# Patient Record
Sex: Male | Born: 1985
Health system: Southern US, Community
[De-identification: ages and names within clinical notes are randomized; demographics above are authoritative.]

## PROBLEM LIST (undated history)

## (undated) DIAGNOSIS — F17211 Nicotine dependence, cigarettes, in remission: Secondary | ICD-10-CM

## (undated) DIAGNOSIS — K921 Melena: Secondary | ICD-10-CM

## (undated) DIAGNOSIS — I83813 Varicose veins of bilateral lower extremities with pain: Secondary | ICD-10-CM

## (undated) DIAGNOSIS — E669 Obesity, unspecified: Secondary | ICD-10-CM

## (undated) DIAGNOSIS — I10 Essential (primary) hypertension: Secondary | ICD-10-CM

## (undated) HISTORY — DX: Obesity, unspecified: E66.9

## (undated) HISTORY — PX: NO PAST SURGERIES: SHX2092

## (undated) HISTORY — DX: Essential (primary) hypertension: I10

## (undated) HISTORY — DX: Nicotine dependence, cigarettes, in remission: F17.211

## (undated) HISTORY — DX: Melena: K92.1

## (undated) HISTORY — DX: Varicose veins of bilateral lower extremities with pain: I83.813

---

## 2020-02-03 DIAGNOSIS — Z13220 Encounter for screening for lipoid disorders: Secondary | ICD-10-CM | POA: Diagnosis not present

## 2020-02-03 DIAGNOSIS — F17200 Nicotine dependence, unspecified, uncomplicated: Secondary | ICD-10-CM | POA: Diagnosis not present

## 2020-02-03 DIAGNOSIS — I10 Essential (primary) hypertension: Secondary | ICD-10-CM | POA: Diagnosis not present

## 2020-02-03 DIAGNOSIS — I83813 Varicose veins of bilateral lower extremities with pain: Secondary | ICD-10-CM | POA: Diagnosis not present

## 2020-02-03 DIAGNOSIS — Z0000 Encounter for general adult medical examination without abnormal findings: Secondary | ICD-10-CM | POA: Diagnosis not present

## 2020-03-05 DIAGNOSIS — Z20822 Contact with and (suspected) exposure to covid-19: Secondary | ICD-10-CM | POA: Diagnosis not present

## 2020-03-08 DIAGNOSIS — Z6829 Body mass index (BMI) 29.0-29.9, adult: Secondary | ICD-10-CM | POA: Diagnosis not present

## 2020-03-08 DIAGNOSIS — F17200 Nicotine dependence, unspecified, uncomplicated: Secondary | ICD-10-CM | POA: Diagnosis not present

## 2020-03-08 DIAGNOSIS — J209 Acute bronchitis, unspecified: Secondary | ICD-10-CM | POA: Diagnosis not present

## 2020-03-08 DIAGNOSIS — I10 Essential (primary) hypertension: Secondary | ICD-10-CM | POA: Diagnosis not present

## 2020-03-23 ENCOUNTER — Other Ambulatory Visit: Payer: Self-pay

## 2020-03-23 DIAGNOSIS — I8390 Asymptomatic varicose veins of unspecified lower extremity: Secondary | ICD-10-CM

## 2020-04-15 ENCOUNTER — Ambulatory Visit: Payer: BC Managed Care – PPO | Admitting: Physician Assistant

## 2020-04-15 ENCOUNTER — Other Ambulatory Visit: Payer: Self-pay

## 2020-04-15 ENCOUNTER — Ambulatory Visit (HOSPITAL_COMMUNITY)
Admission: RE | Admit: 2020-04-15 | Discharge: 2020-04-15 | Disposition: A | Discharge status: Home / Self Care | Payer: BC Managed Care – PPO | Source: Ambulatory Visit | Attending: Vascular Surgery | Admitting: Vascular Surgery

## 2020-04-15 VITALS — BP 102/67 | HR 82 | Temp 98.7°F | Resp 20 | Ht 71.0 in | Wt 215.6 lb

## 2020-04-15 DIAGNOSIS — I8390 Asymptomatic varicose veins of unspecified lower extremity: Secondary | ICD-10-CM

## 2020-04-15 DIAGNOSIS — I872 Venous insufficiency (chronic) (peripheral): Secondary | ICD-10-CM | POA: Diagnosis not present

## 2020-04-15 NOTE — Progress Notes (Signed)
Office Note     CC:  follow up Requesting Provider:  Julianne Handler, NP  HPI: Raymond Byrd is a 35 y.o. (25-Nov-1985) male who presents for evaluation of bilateral lower extremity pain and varicose veins.  Patient describes episodes of pain that starts around the level of his knee and radiates down to his feet.  Episodes occur at rest and with exertion and can last anywhere from 20 to 30 minutes.  These episodes occur almost daily.  Patient states he has varicose veins especially of right lower extremity.  Patient's mother is also a patient at [facility] and is treated for her varicose veins.  He denies history of DVT, venous ulcerations, trauma, or prior vascular interventions.  He also denies claudication, rest pain, or nonhealing wounds of bilateral lower extremities.  He is a former smoker.  He was also diagnosed with sciatica of right lower extremity however its been about 10 years ago and he believes his symptoms are unrelated  History reviewed. No pertinent past medical history.  History reviewed. No pertinent surgical history.  Social History   Socioeconomic History  . Marital status: Unknown    Spouse name: Not on file  . Number of children: Not on file  . Years of education: Not on file  . Highest education level: Not on file  Occupational History  . Occupation: Holiday representative  Tobacco Use  . Smoking status: Former Smoker    Quit date: 02/27/2020    Years since quitting: 0.1  . Smokeless tobacco: Current User    Types: Snuff  Substance and Sexual Activity  . Alcohol use: Not on file  . Drug use: Not on file  . Sexual activity: Not on file  Other Topics Concern  . Not on file  Social History Narrative  . Not on file   Social Determinants of Health   Financial Resource Strain: Not on file  Food Insecurity: Not on file  Transportation Needs: Not on file  Physical Activity: Not on file  Stress: Not on file  Social Connections: Not on file  Intimate Partner Violence: Not on  file   History reviewed. No pertinent family history.  Current Outpatient Medications  Medication Sig Dispense Refill  . albuterol (VENTOLIN HFA) 108 (90 Base) MCG/ACT inhaler SMARTSIG:2 Puff(s) By Mouth Every 4-6 Hours PRN    . olmesartan (BENICAR) 20 MG tablet Take 20 mg by mouth daily.     No current facility-administered medications for this visit.    Allergies  Allergen Reactions  . Naproxen Hives     REVIEW OF SYSTEMS:   [X]  denotes positive finding, [ ]  denotes negative finding Cardiac  Comments:  Chest pain or chest pressure:    Shortness of breath upon exertion:    Short of breath when lying flat:    Irregular heart rhythm:        Vascular    Pain in calf, thigh, or hip brought on by ambulation:    Pain in feet at night that wakes you up from your sleep:     Blood clot in your veins:    Leg swelling:         Pulmonary    Oxygen at home:    Productive cough:     Wheezing:         Neurologic    Sudden weakness in arms or legs:     Sudden numbness in arms or legs:     Sudden onset of difficulty speaking or slurred speech:  Temporary loss of vision in one eye:     Problems with dizziness:         Gastrointestinal    Blood in stool:     Vomited blood:         Genitourinary    Burning when urinating:     Blood in urine:        Psychiatric    Major depression:         Hematologic    Bleeding problems:    Problems with blood clotting too easily:        Skin    Rashes or ulcers:        Constitutional    Fever or chills:      PHYSICAL EXAMINATION:  Vitals:   04/15/20 1135  BP: 102/67  Pulse: 82  Resp: 20  Temp: 98.7 F (37.1 C)  TempSrc: Temporal  SpO2: 97%  Weight: 215 lb 9.6 oz (97.8 kg)  Height: 5\' 11"  (1.803 m)    General:  WDWN in NAD; vital signs documented above Gait: Not observed HENT: WNL, normocephalic Pulmonary: normal non-labored breathing , without Rales, rhonchi,  wheezing Cardiac: regular HR Abdomen: soft, NT, no  masses Skin: without rashes Vascular Exam/Pulses:  Right Left  Radial 2+ (normal) 2+ (normal)  DP 2+ (normal) 2+ (normal)  PT absent absent    Extremities: without ischemic changes, without Gangrene , without cellulitis; without open wounds;  Musculoskeletal: no muscle wasting or atrophy  Neurologic: A&O X 3;  No focal weakness or paresthesias are detected Psychiatric:  The pt has Normal affect.   Non-Invasive Vascular Imaging:   Venous Reflux Times  +--------------------+---------+------+-----------+------------+--------+  RIGHT        Reflux NoRefluxReflux TimeDiameter cmsComments                  Yes                   +--------------------+---------+------+-----------+------------+--------+  CFV               yes  >1 second             +--------------------+---------+------+-----------+------------+--------+  FV prox       no                         +--------------------+---------+------+-----------+------------+--------+  FV mid       no                         +--------------------+---------+------+-----------+------------+--------+  FV dist       no                         +--------------------+---------+------+-----------+------------+--------+  Popliteal      no                         +--------------------+---------+------+-----------+------------+--------+  GSV at SFJ           yes  >500 ms   0.498        +--------------------+---------+------+-----------+------------+--------+  GSV prox thigh         yes  >500 ms   0.401        +--------------------+---------+------+-----------+------------+--------+  GSV mid thigh    no               0.432         +--------------------+---------+------+-----------+------------+--------+  GSV dist thigh  yes  >500 ms   0.632        +--------------------+---------+------+-----------+------------+--------+  GSV at knee           yes  >500 ms   0.365        +--------------------+---------+------+-----------+------------+--------+  GSV prox calf                    0.341        +--------------------+---------+------+-----------+------------+--------+  GSV mid calf                    0.353        +--------------------+---------+------+-----------+------------+--------+  SSV Pop Fossa    no               0.536        +--------------------+---------+------+-----------+------------+--------+  SSV prox calf    no               0.443        +--------------------+---------+------+-----------+------------+--------+  SSV mid calf                    0.595        +--------------------+---------+------+-----------+------------+--------+  AAGSV SFJ            yes  >500 ms   0.535        +--------------------+---------+------+-----------+------------+--------+  AAGSV Proximal thigh      yes  >500 ms   0.942        +--------------------+---------+------+-----------+------------+--------+  AAGSV mid thigh         yes  >500 ms       tortuous  +--------------------+---------+------+-----------+------------+--------+     +--------------+---------+------+-----------+------------+--------+  LEFT     Reflux NoRefluxReflux TimeDiameter cmsComments               Yes                   +--------------+---------+------+-----------+------------+--------+  CFV            yes  >1 second              +--------------+---------+------+-----------+------------+--------+  FV prox    no                         +--------------+---------+------+-----------+------------+--------+  FV mid    no                         +--------------+---------+------+-----------+------------+--------+  FV dist    no                         +--------------+---------+------+-----------+------------+--------+  Popliteal   no                         +--------------+---------+------+-----------+------------+--------+  GSV at SFJ        yes  >500 ms   0.561        +--------------+---------+------+-----------+------------+--------+  GSV prox thighno               0.538        +--------------+---------+------+-----------+------------+--------+  GSV mid thigh no               0.356        +--------------+---------+------+-----------+------------+--------+  GSV dist thighno               0.361        +--------------+---------+------+-----------+------------+--------+  GSV at knee  no  0.246        +--------------+---------+------+-----------+------------+--------+  GSV prox calf no               0.406        +--------------+---------+------+-----------+------------+--------+  GSV mid calf no               0.201        +--------------+---------+------+-----------+------------+--------+  SSV Pop Fossa no               0.426        +--------------+---------+------+-----------+------------+--------+  SSV prox calf no               0.444        +--------------+---------+------+-----------+------------+--------+  SSV mid calf                 0.486       ASSESSMENT/PLAN:: 35 y.o. male here for evaluation of BLE episodic pain and varicose veins  -Bilateral lower extremities are well perfused with a palpable and symmetrical DP pulses -Ropey varicosities noted along the path of the greater saphenous vein in the right lower extremity; patient also has an incompetent greater saphenous vein bilaterally -Given that patient's main concern is of episodic pain which occurs bilaterally, we will not pursue further work-up for laser ablation treatment -I have however recommended knee-high 15 to 20 mmHg compression to be worn daily, avoiding prolonged sitting and standing, elevating legs when possible, and using NSAIDs for symptoms related to varicose veins -Patient will follow up with PCP next month to reconsider etiology of episodic bilateral lower extremity pain; he may follow up with [facility] as needed   Emilie Rutter, PA-C [facility] [phone]  Clinic MD:   Randie Heinz

## 2020-06-06 DIAGNOSIS — K625 Hemorrhage of anus and rectum: Secondary | ICD-10-CM | POA: Diagnosis not present

## 2020-06-06 DIAGNOSIS — F17211 Nicotine dependence, cigarettes, in remission: Secondary | ICD-10-CM | POA: Diagnosis not present

## 2020-06-06 DIAGNOSIS — Z6829 Body mass index (BMI) 29.0-29.9, adult: Secondary | ICD-10-CM | POA: Diagnosis not present

## 2020-06-06 DIAGNOSIS — I10 Essential (primary) hypertension: Secondary | ICD-10-CM | POA: Diagnosis not present

## 2020-08-02 DIAGNOSIS — K921 Melena: Secondary | ICD-10-CM | POA: Diagnosis not present

## 2020-09-05 DIAGNOSIS — K921 Melena: Secondary | ICD-10-CM | POA: Diagnosis not present

## 2020-09-05 DIAGNOSIS — F17211 Nicotine dependence, cigarettes, in remission: Secondary | ICD-10-CM | POA: Diagnosis not present

## 2020-09-05 DIAGNOSIS — I10 Essential (primary) hypertension: Secondary | ICD-10-CM | POA: Diagnosis not present

## 2020-09-05 DIAGNOSIS — I83813 Varicose veins of bilateral lower extremities with pain: Secondary | ICD-10-CM | POA: Diagnosis not present

## 2020-10-27 DIAGNOSIS — R0609 Other forms of dyspnea: Secondary | ICD-10-CM | POA: Diagnosis not present

## 2020-10-27 DIAGNOSIS — I10 Essential (primary) hypertension: Secondary | ICD-10-CM | POA: Diagnosis not present

## 2020-10-27 DIAGNOSIS — K921 Melena: Secondary | ICD-10-CM | POA: Diagnosis not present

## 2020-10-27 DIAGNOSIS — F17211 Nicotine dependence, cigarettes, in remission: Secondary | ICD-10-CM | POA: Diagnosis not present

## 2020-11-01 ENCOUNTER — Telehealth: Payer: Self-pay

## 2020-11-04 ENCOUNTER — Encounter: Payer: Self-pay | Admitting: *Deleted

## 2020-11-04 ENCOUNTER — Encounter: Payer: Self-pay | Admitting: Cardiology

## 2020-11-04 DIAGNOSIS — K921 Melena: Secondary | ICD-10-CM | POA: Insufficient documentation

## 2020-11-04 DIAGNOSIS — E669 Obesity, unspecified: Secondary | ICD-10-CM | POA: Insufficient documentation

## 2020-11-04 DIAGNOSIS — I83813 Varicose veins of bilateral lower extremities with pain: Secondary | ICD-10-CM | POA: Insufficient documentation

## 2020-11-04 DIAGNOSIS — I10 Essential (primary) hypertension: Secondary | ICD-10-CM | POA: Insufficient documentation

## 2020-11-28 DIAGNOSIS — J984 Other disorders of lung: Secondary | ICD-10-CM | POA: Diagnosis not present

## 2020-11-28 DIAGNOSIS — R918 Other nonspecific abnormal finding of lung field: Secondary | ICD-10-CM | POA: Diagnosis not present

## 2020-11-28 DIAGNOSIS — R0602 Shortness of breath: Secondary | ICD-10-CM | POA: Diagnosis not present

## 2020-11-28 DIAGNOSIS — K625 Hemorrhage of anus and rectum: Secondary | ICD-10-CM | POA: Diagnosis not present

## 2020-11-28 DIAGNOSIS — K921 Melena: Secondary | ICD-10-CM | POA: Diagnosis not present

## 2020-11-28 DIAGNOSIS — J690 Pneumonitis due to inhalation of food and vomit: Secondary | ICD-10-CM | POA: Diagnosis not present

## 2020-11-29 NOTE — Telephone Encounter (Signed)
Close encounter 

## 2020-12-01 DIAGNOSIS — K921 Melena: Secondary | ICD-10-CM | POA: Diagnosis not present

## 2020-12-01 DIAGNOSIS — F17211 Nicotine dependence, cigarettes, in remission: Secondary | ICD-10-CM | POA: Diagnosis not present

## 2020-12-01 DIAGNOSIS — I10 Essential (primary) hypertension: Secondary | ICD-10-CM | POA: Diagnosis not present

## 2020-12-01 DIAGNOSIS — J690 Pneumonitis due to inhalation of food and vomit: Secondary | ICD-10-CM | POA: Diagnosis not present

## 2020-12-20 NOTE — Progress Notes (Signed)
Cardiology Office Note:    Date:  12/21/2020   ID:  Raymond Byrd, DOB November 29, 1985, MRN [id number]  PCP:  Julianne Handler, NP  Cardiologist:  Norman Herrlich, MD   Referring MD: Buckner Malta, MD     1. Palpitations   2. SOB (shortness of breath)   3. Angina pectoris (HCC)   4. Essential hypertension    PLAN:    In order of problems listed above:  He has mild cardiovascular complaints central to his rapid heart rhythm with associated shortness of breath and anginal chest pain.  The episodes are frequent several times a week we will plan to repeat event monitor today and when she has chest discomfort which is atypical for SVT we will have an ischemia evaluation with CTA.  I suspect we will quickly get an answer and I told him to wear the event monitor until he capture several episodes. Stable hypertension continue his ARB thiazide diuretic  Next appointment 6 weeks   Medication Adjustments/Labs and Tests Ordered: Current medicines are reviewed at length with the patient today.  Concerns regarding medicines are outlined above.  No orders of the defined types were placed in this encounter.  No orders of the defined types were placed in this encounter.    Chief Complaint  Patient presents with   Palpitations   Chest Pain   rapid HR    Ongoing for about a month    History of Present Illness:    Raymond Byrd is a 35 y.o. male with a history of hypertension who is being seen today for the evaluation of multiple cardiac complaints including palpitation shortness of breath and chest tightness at the request of Buckner Malta, MD.  He was seen in [facility] ED for chest pain 10/30/2020.  His high-sensitivity troponins were normal EKG showed no ischemic changes and he is felt appropriate for discharge for outpatient evaluation.  Laboratory studies showed hemoglobin 14.6 hematocrit 41.8 platelets 234,000 potassium 3.6 creatinine 1.04 N-terminal proBNP was quite low at 43  chest x-ray normal EKG x2 described as normal.  EKG 10/27/2020 independently reviewed shows sinus rhythm and is normal  His wife is a CNA who accompanies him participates in evaluation decision making For more than a year he has had episodes of intermittent rapid heart rhythm usually last for about 15 minutes he thinks he had 1 syncopal episode more than a year ago associated with it. Unfortunately he does not have a smart watch and he has not captured an episode His wife is felt his cholestyramine has been very perhaps Usually associated with physical effort forces him to stop and rest when he is breathless with think. Also at times he gets discomfort pressure substernal without radiation up into his throat. He has no known history of heart disease congenital rheumatic or heart murmur. Family history is unremarkable for heart disease cardiomyopathy or sudden death He takes no proarrhythmic over-the-counter medications Episodes are increasing frequency now several times a week but previously is gone weeks between episodes they have been variable in nature always associated with the rapid heart rhythm. He is a former smoker. Lipid profile 02/03/2020 at a cholesterol 174 HDL 34 triglycerides 153 and is not diabetic.  Past Medical History:  Diagnosis Date   Essential hypertension    Hematochezia    Nicotine dependence, cigarettes, in remission    Obesity    Varicose veins of leg with pain, bilateral     Past Surgical History:  Procedure Laterality  Date   NO PAST SURGERIES      Current Medications: No outpatient medications have been marked as taking for the 12/21/20 encounter (Office Visit) with Baldo Daub, MD.     Allergies:   Naproxen and Ace inhibitors   Social History   Socioeconomic History   Marital status: Unknown    Spouse name: Not on file   Number of children: Not on file   Years of education: Not on file   Highest education level: Not on file  Occupational  History   Occupation: Holiday representative  Tobacco Use   Smoking status: Former    Types: Cigarettes    Quit date: 02/2020    Years since quitting: 0.8   Smokeless tobacco: Current    Types: Chew  Substance and Sexual Activity   Alcohol use: Never   Drug use: Never   Sexual activity: Not on file  Other Topics Concern   Not on file  Social History Narrative   Not on file   Social Determinants of Health   Financial Resource Strain: Not on file  Food Insecurity: Not on file  Transportation Needs: Not on file  Physical Activity: Not on file  Stress: Not on file  Social Connections: Not on file     Family History: The patient's family history includes COPD in his mother; Emphysema in his mother; Hypertension in his father.  ROS:   ROS Please see the history of present illness.     All other systems reviewed and are negative.  EKGs/Labs/Other Studies Reviewed:    The following studies were reviewed today:   EKG:  EKG is  ordered today.  The ekg ordered today is personally reviewed and demonstrates sinus rhythm normal EKG no preexcitation QT interval is normal.  Physical Exam:    VS:  BP 126/82 (BP Location: Right Arm, Patient Position: Sitting)   Pulse 78   Ht 5\' 11"  (1.803 m)   Wt 234 lb 6.4 oz (106.3 kg)   SpO2 96%   BMI 32.69 kg/m     Wt Readings from Last 3 Encounters:  12/21/20 234 lb 6.4 oz (106.3 kg)  10/27/20 238 lb (108 kg)  04/15/20 215 lb 9.6 oz (97.8 kg)     GEN:   He appears his age looks healthy overweight in no acute distress HEENT: Normal NECK: No JVD; No carotid bruits LYMPHATICS: No lymphadenopathy CARDIAC: RRR, no murmurs, rubs, gallops RESPIRATORY:  Clear to auscultation without rales, wheezing or rhonchi  ABDOMEN: Soft, non-tender, non-distended MUSCULOSKELETAL:  No edema; No deformity  SKIN: Warm and dry NEUROLOGIC:  Alert and oriented x 3 PSYCHIATRIC:  Normal affect     Signed, 04/17/20, MD  12/21/2020 8:49 AM    [facility]

## 2020-12-21 ENCOUNTER — Ambulatory Visit: Payer: BC Managed Care – PPO | Admitting: Cardiology

## 2020-12-21 ENCOUNTER — Ambulatory Visit (INDEPENDENT_AMBULATORY_CARE_PROVIDER_SITE_OTHER): Payer: BC Managed Care – PPO

## 2020-12-21 ENCOUNTER — Other Ambulatory Visit: Payer: Self-pay

## 2020-12-21 ENCOUNTER — Encounter: Payer: Self-pay | Admitting: Cardiology

## 2020-12-21 VITALS — BP 126/82 | HR 78 | Ht 71.0 in | Wt 234.4 lb

## 2020-12-21 DIAGNOSIS — R0602 Shortness of breath: Secondary | ICD-10-CM | POA: Diagnosis not present

## 2020-12-21 DIAGNOSIS — I209 Angina pectoris, unspecified: Secondary | ICD-10-CM

## 2020-12-21 DIAGNOSIS — R002 Palpitations: Secondary | ICD-10-CM

## 2020-12-21 DIAGNOSIS — I10 Essential (primary) hypertension: Secondary | ICD-10-CM | POA: Diagnosis not present

## 2020-12-21 MED ORDER — METOPROLOL TARTRATE 100 MG PO TABS: 100.0000 mg | ORAL_TABLET | Freq: Once | ORAL | 0 refills | Status: DC

## 2020-12-21 NOTE — Patient Instructions (Signed)
Medication Instructions:  Your physician recommends that you continue on your current medications as directed. Please refer to the Current Medication list given to you today.  *If you need a refill on your cardiac medications before your next appointment, please call your pharmacy*   Lab Work: None If you have labs (blood work) drawn today and your tests are completely normal, you will receive your results only by: MyChart Message (if you have MyChart) OR A paper copy in the mail If you have any lab test that is abnormal or we need to change your treatment, we will call you to review the results.   Testing/Procedures: A zio monitor was ordered today. It will remain on for 14 days. You will then return monitor and event diary in provided box. It takes 1-2 weeks for report to be downloaded and returned to [place]. We will call you with the results. If monitor falls off or has orange flashing light, please call Zio for further instructions.     Your cardiac CT will be scheduled at one of the below locations:   [facility] [address] [place], [place] [address] [phone]  OR  [facility] [address] [place], [place] [address] [phone]  If scheduled at [facility], please arrive at the [facility] main entrance (entrance A) of [facility] 30 minutes prior to test start time. You can use the FREE valet parking offered at the main entrance (encouraged to control the heart rate for the test) Proceed to the [facility] Radiology Department (first floor) to check-in and test prep.  If scheduled at [facility], please arrive 15 mins early for check-in and test prep.  Please follow these instructions carefully (unless otherwise directed):  Hold all erectile dysfunction medications at least 3 days (72 hrs) prior to test.  On the Night Before the Test: Be sure to Drink plenty of  water. Do not consume any caffeinated/decaffeinated beverages or chocolate 12 hours prior to your test. Do not take any antihistamines 12 hours prior to your test.   On the Day of the Test: Drink plenty of water until 1 hour prior to the test. Do not eat any food 4 hours prior to the test. You may take your regular medications prior to the test.  Take metoprolol (Lopressor) two hours prior to test. HOLD Furosemide/Hydrochlorothiazide morning of the test.          After the Test: Drink plenty of water. After receiving IV contrast, you may experience a mild flushed feeling. This is normal. On occasion, you may experience a mild rash up to 24 hours after the test. This is not dangerous. If this occurs, you can take Benadryl 25 mg and increase your fluid intake. If you experience trouble breathing, this can be serious. If it is severe call 911 IMMEDIATELY. If it is mild, please call our office. If you take any of these medications: Glipizide/Metformin, Avandament, Glucavance, please do not take 48 hours after completing test unless otherwise instructed.  Please allow 2-4 weeks for scheduling of routine cardiac CTs. Some insurance companies require a pre-authorization which may delay scheduling of this test.   For non-scheduling related questions, please contact the cardiac imaging nurse navigator should you have any questions/concerns: Rockwell Alexandria, Cardiac Imaging Nurse Navigator Larey Brick, Cardiac Imaging Nurse Navigator [facility] Direct Office Dial: [phone]   For scheduling needs, including cancellations and rescheduling, please  call [place], [phone].    Follow-Up: At [facility], you and your health needs are our priority.  As part of our continuing mission to provide you with exceptional heart care, we have created designated Provider Care Teams.  These Care Teams include your primary Cardiologist (physician) and Advanced Practice  Providers (APPs -  Physician Assistants and Nurse Practitioners) who all work together to provide you with the care you need, when you need it.  We recommend signing up for the patient portal called "MyChart".  Sign up information is provided on this After Visit Summary.  MyChart is used to connect with patients for Virtual Visits (Telemedicine).  Patients are able to view lab/test results, encounter notes, upcoming appointments, etc.  Non-urgent messages can be sent to your provider as well.   To learn more about what you can do with MyChart, go to [email or web address].    Your next appointment:   6 week(s)  The format for your next appointment:   In Person  Provider:   Norman Herrlich, MD   Other Instructions  Cardiac CT Angiogram A cardiac CT angiogram is a procedure to look at the heart and the area around the heart. It may be done to help find the cause of chest pains or other symptoms of heart disease. During this procedure, a substance called contrast dye is injected into the blood vessels in the area to be checked. A large X-ray machine, called a CT scanner, then takes detailed pictures of the heart and the surrounding area. The procedure is also sometimes called a coronary CT angiogram, coronary artery scanning, or CTA. A cardiac CT angiogram allows the health care provider to see how well blood is flowing to and from the heart. The health care provider will be able to see if there are any problems, such as: Blockage or narrowing of the coronary arteries in the heart. Fluid around the heart. Signs of weakness or disease in the muscles, valves, and tissues of the heart. Tell a health care provider about: Any allergies you have. This is especially important if you have had a previous allergic reaction to contrast dye. All medicines you are taking, including vitamins, herbs, eye drops, creams, and over-the-counter medicines. Any blood disorders you have. Any surgeries you have  had. Any medical conditions you have. Whether you are pregnant or may be pregnant. Any anxiety disorders, chronic pain, or other conditions you have that may increase your stress or prevent you from lying still. What are the risks? Generally, this is a safe procedure. However, problems may occur, including: Bleeding. Infection. Allergic reactions to medicines or dyes. Damage to other structures or organs. Kidney damage from the contrast dye that is used. Increased risk of cancer from radiation exposure. This risk is low. Talk with your health care provider about: The risks and benefits of testing. How you can receive the lowest dose of radiation. What happens before the procedure? Wear comfortable clothing and remove any jewelry, glasses, dentures, and hearing aids. Follow instructions from your health care provider about eating and drinking. This may include: For 12 hours before the procedure -- avoid caffeine. This includes tea, coffee, soda, energy drinks, and diet pills. Drink plenty of water or other fluids that do not have caffeine in them. Being well hydrated can prevent complications. For 4-6 hours before the procedure -- stop eating and drinking. The contrast dye can cause nausea, but this is less likely if your stomach is empty. Ask your health care provider about changing  or stopping your regular medicines. This is especially important if you are taking diabetes medicines, blood thinners, or medicines to treat problems with erections (erectile dysfunction). What happens during the procedure?  Hair on your chest may need to be removed so that small sticky patches called electrodes can be placed on your chest. These will transmit information that helps to monitor your heart during the procedure. An IV will be inserted into one of your veins. You might be given a medicine to control your heart rate during the procedure. This will help to ensure that good images are obtained. You will  be asked to lie on an exam table. This table will slide in and out of the CT machine during the procedure. Contrast dye will be injected into the IV. You might feel warm, or you may get a metallic taste in your mouth. You will be given a medicine called nitroglycerin. This will relax or dilate the arteries in your heart. The table that you are lying on will move into the CT machine tunnel for the scan. The person running the machine will give you instructions while the scans are being done. You may be asked to: Keep your arms above your head. Hold your breath. Stay very still, even if the table is moving. When the scanning is complete, you will be moved out of the machine. The IV will be removed. The procedure may vary among health care providers and hospitals. What can I expect after the procedure? After your procedure, it is common to have: A metallic taste in your mouth from the contrast dye. A feeling of warmth. A headache from the nitroglycerin. Follow these instructions at home: Take over-the-counter and prescription medicines only as told by your health care provider. If you are told, drink enough fluid to keep your urine pale yellow. This will help to flush the contrast dye out of your body. Most people can return to their normal activities right after the procedure. Ask your health care provider what activities are safe for you. It is up to you to get the results of your procedure. Ask your health care provider, or the department that is doing the procedure, when your results will be ready. Keep all follow-up visits as told by your health care provider. This is important. Contact a health care provider if: You have any symptoms of allergy to the contrast dye. These include: Shortness of breath. Rash or hives. A racing heartbeat. Summary A cardiac CT angiogram is a procedure to look at the heart and the area around the heart. It may be done to help find the cause of chest pains or  other symptoms of heart disease. During this procedure, a large X-ray machine, called a CT scanner, takes detailed pictures of the heart and the surrounding area after a contrast dye has been injected into blood vessels in the area. Ask your health care provider about changing or stopping your regular medicines before the procedure. This is especially important if you are taking diabetes medicines, blood thinners, or medicines to treat erectile dysfunction. If you are told, drink enough fluid to keep your urine pale yellow. This will help to flush the contrast dye out of your body. This information is not intended to replace advice given to you by your health care provider. Make sure you discuss any questions you have with your health care provider. Document Revised: 10/08/2018 Document Reviewed: 10/08/2018 Elsevier Patient Education  2022 Elsevier Inc.       1.  Avoid all over-the-counter antihistamines except Claritin/Loratadine and Zyrtec/Cetrizine. 2. Avoid all combination including cold sinus allergies flu decongestant and sleep medications 3. You can use Robitussin DM Mucinex and Mucinex DM for cough. 4. can use Tylenol aspirin ibuprofen and naproxen but no combinations such as sleep or sinus.   KardiaMobile [email or web address]        FDA-cleared, clinical grade mobile EKG monitor: Lourena Simmonds is the most clinically-validated mobile EKG used by the world's leading cardiac care medical professionals With Basic service, know instantly if your heart rhythm is normal or if atrial fibrillation is detected, and email the last single EKG recording to yourself or your doctor Premium service, available for purchase through the Kardia app for $9.99 per month or $99 per year, includes unlimited history and storage of your EKG recordings, a monthly EKG summary report to share with your doctor, along with the ability to track your blood pressure, activity and weight  Includes one KardiaMobile phone clip FREE SHIPPING: Standard delivery 1-3 business days. Orders placed by 11:00am PST will ship that afternoon. Otherwise, will ship next business day. All orders ship via [employer] from [place], [place]        Overview A computerized tomography (CT) coronary angiogram is an imaging test that looks at the arteries that supply blood to your heart. It might be used to diagnose the cause of chest pain or other symptoms. A CT coronary angiogram relies on a powerful X-ray machine to produce images of your heart and its blood vessels. These tests are noninvasive and don't require recovery time. Coronary CT angiograms are increasingly an option for people with a variety of heart conditions. A traditional (not CT-based) coronary angiogram requires that a flexible tube (catheter) be threaded through your groin or arm to your heart or coronary arteries. If you have known coronary artery disease, your doctor might recommend a traditional coronary angiogram because you can also receive treatment during that procedure. Why it's done A coronary CT angiogram can check your heart for various conditions, but it's primarily used to check for narrowed or blocked arteries in your heart (coronary artery disease). If your test suggests that you have heart disease, you and your health care provider can discuss treatment options.  Risks You'll be exposed to some radiation during the test. The amount varies depending on the type of machine used. The risk of developing cancer from a CT angiogram isn't known, but it's small. However, you shouldn't have a CT angiogram if you're pregnant because of possible harm to your unborn child. It's possible that you could have an allergic reaction to the dye used in the procedure. Talk to your doctor if you're concerned about having an allergic reaction. How you prepare Your doctor should give you instructions about how to prepare for your CT  angiogram. You can drive yourself to the appointment, and you'll be able to drive after your test. Food and medication Usually, you'll be asked not to eat anything for about four hours before your test. You can drink water, but avoid caffeinated drinks 12 hours before your test, because they can increase your heart rate, which can make it difficult for your doctor to get clear pictures of your heart. If you're allergic to the dye used in the procedure, your doctor might ask you to take steroid medication 12 hours before the procedure to reduce your risk of a reaction. Clothing and personal items You'll need to remove clothing above your waist, as well as jewelry and glasses,  and change into a hospital gown. What you can expect CT angiograms are usually performed in the radiology department of a hospital or an outpatient imaging facility. Before the procedure Your doctor might give you a medication called a beta blocker to slow your heart rate, enabling the scan to produce a clearer image. Let your health care provider know if you've had side effects from beta blockers in the past. You might also be given nitroglycerin to widen (dilate) your coronary arteries. If you're allergic to contrast material, you might be given medication to lower your risk of a reaction. During the procedure A technician will give you numbing medication before inserting an intravenous (IV) line in your hand or arm to inject the dye that will make your heart's arteries visible on the images. Although the actual scanning portion of the test takes as few as five seconds, it may take up to an hour for the whole process. The technician will place electrodes on your chest to record your heart rate. You'll lie on a long table that slides through a short, tunnel-like machine. During the scan you'll need to stay still and hold your breath so as not to blur the images. If you're uncomfortable in closed spaces, ask your doctor if you need  medication to help you relax. A technician will operate the machine from a room that's separated from your exam room by a glass window. There will be an intercom system that allows you and the technician to communicate with each other. After the procedure After your CT angiogram is completed, you can return to your normal daily activities. You should be able to drive yourself home or to work. Drink plenty of water to help flush the dye from your system. The images from your CT angiogram should be ready soon after your test. Usually, the health care provider who asked you to have a CT angiogram should discuss the results of the test with you.

## 2020-12-30 DIAGNOSIS — R0609 Other forms of dyspnea: Secondary | ICD-10-CM | POA: Diagnosis not present

## 2020-12-30 DIAGNOSIS — J690 Pneumonitis due to inhalation of food and vomit: Secondary | ICD-10-CM | POA: Diagnosis not present

## 2020-12-30 DIAGNOSIS — I10 Essential (primary) hypertension: Secondary | ICD-10-CM | POA: Diagnosis not present

## 2020-12-30 DIAGNOSIS — F17211 Nicotine dependence, cigarettes, in remission: Secondary | ICD-10-CM | POA: Diagnosis not present

## 2021-01-04 ENCOUNTER — Telehealth (HOSPITAL_COMMUNITY): Payer: Self-pay | Admitting: *Deleted

## 2021-01-04 NOTE — Telephone Encounter (Signed)
Reaching out to patient to offer assistance regarding upcoming cardiac imaging study; pt verbalizes understanding of appt date/time, parking situation and where to check in, pre-test NPO status and medications ordered, and verified current allergies; name and call back number provided for further questions should they arise  Larey Brick RN Navigator Cardiac Imaging [facility] [phone] office [phone] cell  Patient to take 100mg  metoprolol tartrate two hours prior to cardiac CT scan. He is aware to arrive at 9am for his 9:30am cardiac CT scan.

## 2021-01-05 ENCOUNTER — Other Ambulatory Visit: Payer: Self-pay

## 2021-01-05 ENCOUNTER — Other Ambulatory Visit: Payer: Self-pay | Admitting: Cardiology

## 2021-01-05 ENCOUNTER — Encounter (HOSPITAL_COMMUNITY): Payer: Self-pay

## 2021-01-05 ENCOUNTER — Ambulatory Visit (HOSPITAL_COMMUNITY)
Admission: RE | Admit: 2021-01-05 | Discharge: 2021-01-05 | Disposition: A | Discharge status: Home / Self Care | Payer: BC Managed Care – PPO | Source: Ambulatory Visit | Attending: Cardiology | Admitting: Cardiology

## 2021-01-05 DIAGNOSIS — I209 Angina pectoris, unspecified: Secondary | ICD-10-CM | POA: Insufficient documentation

## 2021-01-05 DIAGNOSIS — R0602 Shortness of breath: Secondary | ICD-10-CM | POA: Diagnosis not present

## 2021-01-05 DIAGNOSIS — R002 Palpitations: Secondary | ICD-10-CM | POA: Insufficient documentation

## 2021-01-05 DIAGNOSIS — I10 Essential (primary) hypertension: Secondary | ICD-10-CM | POA: Diagnosis not present

## 2021-01-05 MED ORDER — IOHEXOL 350 MG/ML SOLN
95.0000 mL | Freq: Once | INTRAVENOUS | Status: AC | PRN
  Administered 2021-01-05: 95 mL via INTRAVENOUS

## 2021-01-05 MED ORDER — NITROGLYCERIN 0.4 MG SL SUBL
0.8000 mg | SUBLINGUAL_TABLET | Freq: Once | SUBLINGUAL | Status: AC
  Administered 2021-01-05: 0.8 mg via SUBLINGUAL

## 2021-01-05 MED ORDER — NITROGLYCERIN 0.4 MG SL SUBL
SUBLINGUAL_TABLET | SUBLINGUAL | Status: AC
  Filled 2021-01-05: qty 2

## 2021-01-09 ENCOUNTER — Telehealth: Payer: Self-pay

## 2021-01-09 NOTE — Telephone Encounter (Signed)
-----   Message from Baldo Daub, MD sent at 01/09/2021  9:05 AM EST ----- Great result cardiac CTA is normal.  He has a common problem fatty liver or hepatic steatosis and he needs to lose weight rarely in individuals with trouble with liver function

## 2021-01-09 NOTE — Telephone Encounter (Signed)
Left message on patients voicemail to please return our call.   

## 2021-01-09 NOTE — Telephone Encounter (Signed)
Patient is returning call.  °

## 2021-01-09 NOTE — Telephone Encounter (Signed)
Spoke with patient regarding results and recommendation.  Patient verbalizes understanding and is agreeable to plan of care. Advised patient to call back with any issues or concerns.  

## 2021-01-13 ENCOUNTER — Other Ambulatory Visit: Payer: Self-pay | Admitting: Cardiology

## 2021-01-13 ENCOUNTER — Telehealth: Payer: Self-pay

## 2021-01-13 MED ORDER — METOPROLOL SUCCINATE ER 25 MG PO TB24: 25.0000 mg | ORAL_TABLET | Freq: Every day | ORAL | 0 refills | Status: DC

## 2021-01-13 NOTE — Telephone Encounter (Signed)
Spoke with patient regarding results and recommendation.  Patient verbalizes understanding and is agreeable to plan of care. Advised patient to call back with any issues or concerns.  

## 2021-01-13 NOTE — Telephone Encounter (Signed)
-----   Message from Baldo Daub, MD sent at 01/13/2021  2:47 PM EST ----- His monitor shows occasional extra beats, PVCs, he is aware of these.  If bothersome we can place him on low-dose beta-blocker Toprol-XL 25 mg daily in the future he can take as needed.  If he wants to try it #30 take 1 daily and he should have appointment for follow-up with

## 2021-01-31 DIAGNOSIS — F17211 Nicotine dependence, cigarettes, in remission: Secondary | ICD-10-CM | POA: Insufficient documentation

## 2021-02-05 NOTE — Progress Notes (Signed)
Cardiology Office Note:    Date:  02/06/2021   ID:  Raymond Byrd, DOB 05-May-1985, MRN [id number]  PCP:  Julianne Handler, NP  Cardiologist:  Norman Herrlich, MD    Referring MD: Julianne Handler, NP    ASSESSMENT:    1. Chest pain of uncertain etiology   2. Essential hypertension   3. Palpitations    PLAN:    In order of problems listed above:  Chest pain is noncardiac calcium score of 0 normal coronary arteriography.  I think he benefit in terms of palpitation resting tachycardia and BP optimize therapy starting the beta-blocker at least until he loses weight.  The power of 0 last for about 5 to 7 years without diabetes placement very low cardiovascular risk but I do think he should repeat his calcium score in about 6 years.   Next appointment: As needed    Medication Adjustments/Labs and Tests Ordered: Current medicines are reviewed at length with the patient today.  Concerns regarding medicines are outlined above.  No orders of the defined types were placed in this encounter.  No orders of the defined types were placed in this encounter.   Chief complaint: Follow-up after cardiac CTA   History of Present Illness:    Raymond Byrd is a 35 y.o. male with a hx of hypertension last seen 12/21/2020 for the evaluation of multiple cardiac complaints including palpitation shortness of breath and chest pain.He was seen in [facility] for chest pain 10/30/2020.  His high-sensitivity troponins were normal EKG showed no ischemic changes and he is felt appropriate for discharge for outpatient evaluation.  Laboratory studies showed hemoglobin 14.6 hematocrit 41.8 platelets 234,000 potassium 3.6 creatinine 1.04 N-terminal proBNP was quite low at 43 chest x-ray normal EKG x2 described as normal.   Compliance with diet, lifestyle and medications: Yes  He is reassured by the findings of his cardiac CTA I did encourage him to do repeat coronary calcium score in 6 years. He notices his  heart seems rapid when he does vigorous activity but acknowledges he is overweight and is committed to weight loss approximately 30 pounds.  He does have hepatic steatosis. Smart watch shows resting heart rates running 100 -110 bpm he did not start a beta-blocker.  He underwent to be evaluated chest pain cardiac CTA was performed reported 01/06/2021 normal coronary origins normal coronary arteries coronary calcium score of 0.  He did have hepatic steatosis.  Event monitor for 14 days reported 01/13/2021 showed occasional ventricular ectopy and the symptomatic events were associated with PVCs and frequent PVCs. Past Medical History:  Diagnosis Date   Essential hypertension    Hematochezia    Nicotine dependence, cigarettes, in remission    Obesity    Varicose veins of leg with pain, bilateral     Past Surgical History:  Procedure Laterality Date   NO PAST SURGERIES      Current Medications: Current Meds  Medication Sig   losartan-hydrochlorothiazide (HYZAAR) 100-12.5 MG tablet Take 1 tablet by mouth daily.     Allergies:   Naproxen and Ace inhibitors   Social History   Socioeconomic History   Marital status: Unknown    Spouse name: Not on file   Number of children: Not on file   Years of education: Not on file   Highest education level: Not on file  Occupational History   Occupation: Holiday representative  Tobacco Use   Smoking status: Former    Types: Cigarettes  Quit date: 02/2020    Years since quitting: 0.9   Smokeless tobacco: Current    Types: Chew  Substance and Sexual Activity   Alcohol use: Never   Drug use: Never   Sexual activity: Not on file  Other Topics Concern   Not on file  Social History Narrative   Not on file   Social Determinants of Health   Financial Resource Strain: Not on file  Food Insecurity: Not on file  Transportation Needs: Not on file  Physical Activity: Not on file  Stress: Not on file  Social Connections: Not on file     Family  History: The patient's family history includes COPD in his mother; Emphysema in his mother; Hypertension in his father. ROS:   Please see the history of present illness.    All other systems reviewed and are negative.  EKGs/Labs/Other Studies Reviewed:    The following studies were reviewed today:    Physical Exam:    VS:  BP 134/82   Pulse 92   Ht 5\' 11"  (1.803 m)   Wt 238 lb 12.8 oz (108.3 kg)   SpO2 97%   BMI 33.31 kg/m     Wt Readings from Last 3 Encounters:  02/06/21 238 lb 12.8 oz (108.3 kg)  12/21/20 234 lb 6.4 oz (106.3 kg)  10/27/20 238 lb (108 kg)     GEN:  Well nourished, well developed in no acute distress HEENT: Normal NECK: No JVD; No carotid bruits LYMPHATICS: No lymphadenopathy CARDIAC: RRR, no murmurs, rubs, gallops RESPIRATORY:  Clear to auscultation without rales, wheezing or rhonchi  ABDOMEN: Soft, non-tender, non-distended MUSCULOSKELETAL:  No edema; No deformity  SKIN: Warm and dry NEUROLOGIC:  Alert and oriented x 3 PSYCHIATRIC:  Normal affect    Signed, Shirlee More, MD  02/06/2021 1:13 PM    [facility]

## 2021-02-06 ENCOUNTER — Ambulatory Visit: Payer: BC Managed Care – PPO | Admitting: Cardiology

## 2021-02-06 ENCOUNTER — Encounter: Payer: Self-pay | Admitting: Cardiology

## 2021-02-06 VITALS — BP 134/82 | HR 92 | Ht 71.0 in | Wt 238.8 lb

## 2021-02-06 DIAGNOSIS — I10 Essential (primary) hypertension: Secondary | ICD-10-CM | POA: Diagnosis not present

## 2021-02-06 DIAGNOSIS — Z0000 Encounter for general adult medical examination without abnormal findings: Secondary | ICD-10-CM | POA: Diagnosis not present

## 2021-02-06 DIAGNOSIS — E669 Obesity, unspecified: Secondary | ICD-10-CM | POA: Diagnosis not present

## 2021-02-06 DIAGNOSIS — R079 Chest pain, unspecified: Secondary | ICD-10-CM

## 2021-02-06 DIAGNOSIS — F17211 Nicotine dependence, cigarettes, in remission: Secondary | ICD-10-CM | POA: Diagnosis not present

## 2021-02-06 DIAGNOSIS — R002 Palpitations: Secondary | ICD-10-CM

## 2021-02-06 NOTE — Patient Instructions (Signed)
Medication Instructions:  Your physician has recommended you make the following change in your medication:   Take Toprol XL daily  *If you need a refill on your cardiac medications before your next appointment, please call your pharmacy*   Lab Work: None ordered If you have labs (blood work) drawn today and your tests are completely normal, you will receive your results only by: MyChart Message (if you have MyChart) OR A paper copy in the mail If you have any lab test that is abnormal or we need to change your treatment, we will call you to review the results.   Testing/Procedures: None ordered    Follow-Up: At [facility], you and your health needs are our priority.  As part of our continuing mission to provide you with exceptional heart care, we have created designated Provider Care Teams.  These Care Teams include your primary Cardiologist (physician) and Advanced Practice Providers (APPs -  Physician Assistants and Nurse Practitioners) who all work together to provide you with the care you need, when you need it.  We recommend signing up for the patient portal called "MyChart".  Sign up information is provided on this After Visit Summary.  MyChart is used to connect with patients for Virtual Visits (Telemedicine).  Patients are able to view lab/test results, encounter notes, upcoming appointments, etc.  Non-urgent messages can be sent to your provider as well.   To learn more about what you can do with MyChart, go to [email or web address].    Your next appointment:   As needed  The format for your next appointment:   In Person  Provider:   Norman Herrlich, MD   Other Instructions  Coronary Calcium Scan A coronary calcium scan is an imaging test used to look for deposits of plaque in the inner lining of the blood vessels of the heart (coronary arteries). Plaque is made up of calcium, protein, and fatty substances. These deposits of plaque can partly clog and narrow the  coronary arteries without producing any symptoms or warning signs. This puts a person at risk for a heart attack. This test is recommended for people who are at moderate risk for heart disease. The test can find plaque deposits before symptoms develop. Tell a health care provider about: Any allergies you have. All medicines you are taking, including vitamins, herbs, eye drops, creams, and over-the-counter medicines. Any problems you or family members have had with anesthetic medicines. Any blood disorders you have. Any surgeries you have had. Any medical conditions you have. Whether you are pregnant or may be pregnant. What are the risks? Generally, this is a safe procedure. However, problems may occur, including: Harm to a pregnant woman and her unborn baby. This test involves the use of radiation. Radiation exposure can be dangerous to a pregnant woman and her unborn baby. If you are pregnant or think you may be pregnant, you should not have this procedure done. Slight increase in the risk of cancer. This is because of the radiation involved in the test. What happens before the procedure? Ask your health care provider for any specific instructions on how to prepare for this procedure. You may be asked to avoid products that contain caffeine, tobacco, or nicotine for 4 hours before the procedure. What happens during the procedure? You will undress and remove any jewelry from your neck or chest. You will put on a hospital gown. Sticky electrodes will be placed on your chest. The electrodes will be connected to an electrocardiogram (ECG) machine to  record a tracing of the electrical activity of your heart. You will lie down on a curved bed that is attached to the CT scanner. You may be given medicine to slow down your heart rate so that clear pictures can be created. You will be moved into the CT scanner, and the CT scanner will take pictures of your heart. During this time, you will be asked to  lie still and hold your breath for 2-3 seconds at a time while each picture of your heart is being taken. The procedure may vary among health care providers and hospitals.    What happens after the procedure? You can get dressed. You can return to your normal activities. It is up to you to get the results of your procedure. Ask your health care provider, or the department that is doing the procedure, when your results will be ready. Summary A coronary calcium scan is an imaging test used to look for deposits of plaque in the inner lining of the blood vessels of the heart (coronary arteries). Plaque is made up of calcium, protein, and fatty substances. Generally, this is a safe procedure. Tell your health care provider if you are pregnant or may be pregnant. Ask your health care provider for any specific instructions on how to prepare for this procedure. A CT scanner will take pictures of your heart. You can return to your normal activities after the scan is done. This information is not intended to replace advice given to you by your health care provider. Make sure you discuss any questions you have with your health care provider. Document Revised: 09/02/2018 Document Reviewed: 09/02/2018 Elsevier Patient Education  2021 [employer].

## 2021-02-13 ENCOUNTER — Telehealth: Payer: Self-pay | Admitting: Cardiology

## 2021-02-13 NOTE — Telephone Encounter (Signed)
Office notes sent per request.

## 2021-02-13 NOTE — Telephone Encounter (Signed)
Raymond Byrd with [facility] is calling to confirm that patient showed up for appointment on 12/12 with Dr. Dulce Sellar. I confirmed patient came in for appointment and she requested office visit notes. She would like to have the notes faxed to [phone].

## 2021-02-15 DIAGNOSIS — Z20822 Contact with and (suspected) exposure to covid-19: Secondary | ICD-10-CM | POA: Diagnosis not present

## 2021-02-15 DIAGNOSIS — J029 Acute pharyngitis, unspecified: Secondary | ICD-10-CM | POA: Diagnosis not present

## 2021-02-15 DIAGNOSIS — J069 Acute upper respiratory infection, unspecified: Secondary | ICD-10-CM | POA: Diagnosis not present

## 2021-03-03 DIAGNOSIS — I10 Essential (primary) hypertension: Secondary | ICD-10-CM | POA: Diagnosis not present

## 2021-03-03 DIAGNOSIS — R0602 Shortness of breath: Secondary | ICD-10-CM | POA: Diagnosis not present

## 2021-03-03 DIAGNOSIS — R002 Palpitations: Secondary | ICD-10-CM | POA: Diagnosis not present

## 2021-03-03 DIAGNOSIS — R42 Dizziness and giddiness: Secondary | ICD-10-CM | POA: Diagnosis not present

## 2021-03-03 DIAGNOSIS — I83001 Varicose veins of unspecified lower extremity with ulcer of thigh: Secondary | ICD-10-CM | POA: Diagnosis not present

## 2021-03-05 ENCOUNTER — Other Ambulatory Visit: Payer: Self-pay | Admitting: Cardiology

## 2021-03-06 ENCOUNTER — Other Ambulatory Visit: Payer: Self-pay | Admitting: Cardiology

## 2021-03-10 DIAGNOSIS — R002 Palpitations: Secondary | ICD-10-CM | POA: Diagnosis not present

## 2021-04-02 DIAGNOSIS — Z886 Allergy status to analgesic agent status: Secondary | ICD-10-CM | POA: Diagnosis not present

## 2021-04-02 DIAGNOSIS — X500XXA Overexertion from strenuous movement or load, initial encounter: Secondary | ICD-10-CM | POA: Diagnosis not present

## 2021-04-02 DIAGNOSIS — M4306 Spondylolysis, lumbar region: Secondary | ICD-10-CM | POA: Diagnosis not present

## 2021-04-02 DIAGNOSIS — R0789 Other chest pain: Secondary | ICD-10-CM | POA: Diagnosis not present

## 2021-04-02 DIAGNOSIS — M5441 Lumbago with sciatica, right side: Secondary | ICD-10-CM | POA: Diagnosis not present

## 2021-04-02 DIAGNOSIS — Z79899 Other long term (current) drug therapy: Secondary | ICD-10-CM | POA: Diagnosis not present

## 2021-04-02 DIAGNOSIS — M545 Low back pain, unspecified: Secondary | ICD-10-CM | POA: Diagnosis not present

## 2021-04-02 DIAGNOSIS — S39012A Strain of muscle, fascia and tendon of lower back, initial encounter: Secondary | ICD-10-CM | POA: Diagnosis not present

## 2021-04-02 DIAGNOSIS — Z87891 Personal history of nicotine dependence: Secondary | ICD-10-CM | POA: Diagnosis not present

## 2021-04-02 DIAGNOSIS — M47816 Spondylosis without myelopathy or radiculopathy, lumbar region: Secondary | ICD-10-CM | POA: Diagnosis not present

## 2021-04-02 DIAGNOSIS — R079 Chest pain, unspecified: Secondary | ICD-10-CM | POA: Diagnosis not present

## 2021-04-02 DIAGNOSIS — M5431 Sciatica, right side: Secondary | ICD-10-CM | POA: Diagnosis not present

## 2021-04-04 ENCOUNTER — Other Ambulatory Visit: Payer: Self-pay | Admitting: Cardiology

## 2021-04-04 DIAGNOSIS — R002 Palpitations: Secondary | ICD-10-CM | POA: Diagnosis not present

## 2021-04-04 DIAGNOSIS — R079 Chest pain, unspecified: Secondary | ICD-10-CM | POA: Diagnosis not present

## 2021-04-04 DIAGNOSIS — R0602 Shortness of breath: Secondary | ICD-10-CM | POA: Diagnosis not present

## 2021-04-19 DIAGNOSIS — R002 Palpitations: Secondary | ICD-10-CM | POA: Diagnosis not present

## 2021-04-19 DIAGNOSIS — R079 Chest pain, unspecified: Secondary | ICD-10-CM | POA: Diagnosis not present

## 2021-04-19 DIAGNOSIS — R0602 Shortness of breath: Secondary | ICD-10-CM | POA: Diagnosis not present

## 2021-05-05 ENCOUNTER — Other Ambulatory Visit: Payer: Self-pay | Admitting: Cardiology

## 2021-05-05 NOTE — Telephone Encounter (Signed)
Metoprolol Succinate 25 mg # 30 tablets only with message for pharmacy and patient to request future refills from pcp. Patient is followed with our office as needed ?

## 2021-05-11 DIAGNOSIS — R002 Palpitations: Secondary | ICD-10-CM | POA: Diagnosis not present

## 2021-05-11 DIAGNOSIS — R0602 Shortness of breath: Secondary | ICD-10-CM | POA: Diagnosis not present

## 2021-05-11 DIAGNOSIS — R079 Chest pain, unspecified: Secondary | ICD-10-CM | POA: Diagnosis not present

## 2021-06-03 ENCOUNTER — Other Ambulatory Visit: Payer: Self-pay | Admitting: Cardiology

## 2021-06-06 DIAGNOSIS — R002 Palpitations: Secondary | ICD-10-CM | POA: Diagnosis not present

## 2021-06-06 DIAGNOSIS — M7989 Other specified soft tissue disorders: Secondary | ICD-10-CM | POA: Diagnosis not present

## 2021-06-06 DIAGNOSIS — R0602 Shortness of breath: Secondary | ICD-10-CM | POA: Diagnosis not present

## 2021-06-06 DIAGNOSIS — I83001 Varicose veins of unspecified lower extremity with ulcer of thigh: Secondary | ICD-10-CM | POA: Diagnosis not present

## 2021-06-06 DIAGNOSIS — I10 Essential (primary) hypertension: Secondary | ICD-10-CM | POA: Diagnosis not present

## 2021-06-27 DIAGNOSIS — M7989 Other specified soft tissue disorders: Secondary | ICD-10-CM | POA: Diagnosis not present

## 2021-08-07 DIAGNOSIS — G2581 Restless legs syndrome: Secondary | ICD-10-CM | POA: Diagnosis not present

## 2021-08-07 DIAGNOSIS — I10 Essential (primary) hypertension: Secondary | ICD-10-CM | POA: Diagnosis not present

## 2021-08-07 DIAGNOSIS — F17211 Nicotine dependence, cigarettes, in remission: Secondary | ICD-10-CM | POA: Diagnosis not present

## 2021-08-07 DIAGNOSIS — I83813 Varicose veins of bilateral lower extremities with pain: Secondary | ICD-10-CM | POA: Diagnosis not present

## 2021-08-25 DIAGNOSIS — R000 Tachycardia, unspecified: Secondary | ICD-10-CM | POA: Diagnosis not present

## 2021-08-25 DIAGNOSIS — I83813 Varicose veins of bilateral lower extremities with pain: Secondary | ICD-10-CM | POA: Diagnosis not present

## 2021-08-25 DIAGNOSIS — I10 Essential (primary) hypertension: Secondary | ICD-10-CM | POA: Diagnosis not present

## 2021-08-25 DIAGNOSIS — R0789 Other chest pain: Secondary | ICD-10-CM | POA: Diagnosis not present

## 2021-09-26 DIAGNOSIS — Z6832 Body mass index (BMI) 32.0-32.9, adult: Secondary | ICD-10-CM | POA: Diagnosis not present

## 2021-09-26 DIAGNOSIS — R0602 Shortness of breath: Secondary | ICD-10-CM | POA: Diagnosis not present

## 2021-09-26 DIAGNOSIS — Z87891 Personal history of nicotine dependence: Secondary | ICD-10-CM | POA: Diagnosis not present

## 2021-09-26 DIAGNOSIS — E669 Obesity, unspecified: Secondary | ICD-10-CM | POA: Diagnosis not present

## 2021-09-26 DIAGNOSIS — I10 Essential (primary) hypertension: Secondary | ICD-10-CM | POA: Diagnosis not present

## 2021-12-11 DIAGNOSIS — I83813 Varicose veins of bilateral lower extremities with pain: Secondary | ICD-10-CM | POA: Diagnosis not present

## 2021-12-11 DIAGNOSIS — R0602 Shortness of breath: Secondary | ICD-10-CM | POA: Diagnosis not present

## 2021-12-11 DIAGNOSIS — I10 Essential (primary) hypertension: Secondary | ICD-10-CM | POA: Diagnosis not present

## 2021-12-18 DIAGNOSIS — I83819 Varicose veins of unspecified lower extremities with pain: Secondary | ICD-10-CM | POA: Diagnosis not present

## 2021-12-18 DIAGNOSIS — I10 Essential (primary) hypertension: Secondary | ICD-10-CM | POA: Diagnosis not present

## 2021-12-18 DIAGNOSIS — R0602 Shortness of breath: Secondary | ICD-10-CM | POA: Diagnosis not present

## 2021-12-18 DIAGNOSIS — I83893 Varicose veins of bilateral lower extremities with other complications: Secondary | ICD-10-CM | POA: Diagnosis not present

## 2022-01-02 DIAGNOSIS — Z886 Allergy status to analgesic agent status: Secondary | ICD-10-CM | POA: Diagnosis not present

## 2022-01-02 DIAGNOSIS — Z79899 Other long term (current) drug therapy: Secondary | ICD-10-CM | POA: Diagnosis not present

## 2022-01-02 DIAGNOSIS — Z87891 Personal history of nicotine dependence: Secondary | ICD-10-CM | POA: Diagnosis not present

## 2022-01-02 DIAGNOSIS — I83811 Varicose veins of right lower extremities with pain: Secondary | ICD-10-CM | POA: Diagnosis not present

## 2022-01-02 DIAGNOSIS — Z6833 Body mass index (BMI) 33.0-33.9, adult: Secondary | ICD-10-CM | POA: Diagnosis not present

## 2022-01-02 DIAGNOSIS — I10 Essential (primary) hypertension: Secondary | ICD-10-CM | POA: Diagnosis not present

## 2022-01-02 DIAGNOSIS — E669 Obesity, unspecified: Secondary | ICD-10-CM | POA: Diagnosis not present

## 2022-01-29 DIAGNOSIS — E782 Mixed hyperlipidemia: Secondary | ICD-10-CM | POA: Diagnosis not present

## 2022-01-29 DIAGNOSIS — G2581 Restless legs syndrome: Secondary | ICD-10-CM | POA: Diagnosis not present

## 2022-01-29 DIAGNOSIS — F17211 Nicotine dependence, cigarettes, in remission: Secondary | ICD-10-CM | POA: Diagnosis not present

## 2022-01-29 DIAGNOSIS — I10 Essential (primary) hypertension: Secondary | ICD-10-CM | POA: Diagnosis not present

## 2022-06-12 DIAGNOSIS — I10 Essential (primary) hypertension: Secondary | ICD-10-CM | POA: Diagnosis not present

## 2022-06-12 DIAGNOSIS — Z1330 Encounter for screening examination for mental health and behavioral disorders, unspecified: Secondary | ICD-10-CM | POA: Diagnosis not present

## 2022-06-12 DIAGNOSIS — M7989 Other specified soft tissue disorders: Secondary | ICD-10-CM | POA: Diagnosis not present

## 2022-06-12 DIAGNOSIS — M549 Dorsalgia, unspecified: Secondary | ICD-10-CM | POA: Diagnosis not present

## 2022-06-12 DIAGNOSIS — M5431 Sciatica, right side: Secondary | ICD-10-CM | POA: Diagnosis not present

## 2022-06-20 DIAGNOSIS — M7989 Other specified soft tissue disorders: Secondary | ICD-10-CM | POA: Diagnosis not present

## 2022-06-28 DIAGNOSIS — I10 Essential (primary) hypertension: Secondary | ICD-10-CM | POA: Diagnosis not present

## 2022-06-28 DIAGNOSIS — M5416 Radiculopathy, lumbar region: Secondary | ICD-10-CM | POA: Diagnosis not present

## 2022-07-16 DIAGNOSIS — M4727 Other spondylosis with radiculopathy, lumbosacral region: Secondary | ICD-10-CM | POA: Diagnosis not present

## 2022-07-16 DIAGNOSIS — Z1389 Encounter for screening for other disorder: Secondary | ICD-10-CM | POA: Diagnosis not present

## 2022-07-16 DIAGNOSIS — M4807 Spinal stenosis, lumbosacral region: Secondary | ICD-10-CM | POA: Diagnosis not present

## 2022-07-16 DIAGNOSIS — M5117 Intervertebral disc disorders with radiculopathy, lumbosacral region: Secondary | ICD-10-CM | POA: Diagnosis not present

## 2022-08-06 DIAGNOSIS — M5416 Radiculopathy, lumbar region: Secondary | ICD-10-CM | POA: Diagnosis not present

## 2022-08-14 DIAGNOSIS — M5416 Radiculopathy, lumbar region: Secondary | ICD-10-CM | POA: Diagnosis not present

## 2022-08-21 DIAGNOSIS — M5416 Radiculopathy, lumbar region: Secondary | ICD-10-CM | POA: Diagnosis not present

## 2022-08-21 DIAGNOSIS — I10 Essential (primary) hypertension: Secondary | ICD-10-CM | POA: Diagnosis not present

## 2022-08-22 DIAGNOSIS — M5416 Radiculopathy, lumbar region: Secondary | ICD-10-CM | POA: Diagnosis not present

## 2022-08-27 DIAGNOSIS — M5416 Radiculopathy, lumbar region: Secondary | ICD-10-CM | POA: Diagnosis not present

## 2022-09-03 DIAGNOSIS — M5416 Radiculopathy, lumbar region: Secondary | ICD-10-CM | POA: Diagnosis not present

## 2022-09-10 DIAGNOSIS — M5416 Radiculopathy, lumbar region: Secondary | ICD-10-CM | POA: Diagnosis not present

## 2022-10-12 DIAGNOSIS — I83813 Varicose veins of bilateral lower extremities with pain: Secondary | ICD-10-CM | POA: Diagnosis not present

## 2022-10-12 DIAGNOSIS — I10 Essential (primary) hypertension: Secondary | ICD-10-CM | POA: Diagnosis not present

## 2022-10-12 DIAGNOSIS — E669 Obesity, unspecified: Secondary | ICD-10-CM | POA: Diagnosis not present

## 2022-11-27 DIAGNOSIS — I10 Essential (primary) hypertension: Secondary | ICD-10-CM | POA: Diagnosis not present

## 2022-11-27 DIAGNOSIS — M47816 Spondylosis without myelopathy or radiculopathy, lumbar region: Secondary | ICD-10-CM | POA: Diagnosis not present

## 2022-11-27 DIAGNOSIS — S335XXA Sprain of ligaments of lumbar spine, initial encounter: Secondary | ICD-10-CM | POA: Diagnosis not present

## 2022-11-27 DIAGNOSIS — M5416 Radiculopathy, lumbar region: Secondary | ICD-10-CM | POA: Diagnosis not present

## 2022-12-03 DIAGNOSIS — I10 Essential (primary) hypertension: Secondary | ICD-10-CM | POA: Diagnosis not present

## 2022-12-03 DIAGNOSIS — G479 Sleep disorder, unspecified: Secondary | ICD-10-CM | POA: Diagnosis not present

## 2022-12-03 DIAGNOSIS — F17211 Nicotine dependence, cigarettes, in remission: Secondary | ICD-10-CM | POA: Diagnosis not present

## 2022-12-04 DIAGNOSIS — Z1331 Encounter for screening for depression: Secondary | ICD-10-CM | POA: Diagnosis not present

## 2022-12-04 DIAGNOSIS — Z13228 Encounter for screening for other metabolic disorders: Secondary | ICD-10-CM | POA: Diagnosis not present

## 2022-12-04 DIAGNOSIS — I10 Essential (primary) hypertension: Secondary | ICD-10-CM | POA: Diagnosis not present

## 2022-12-04 DIAGNOSIS — E66811 Obesity, class 1: Secondary | ICD-10-CM | POA: Diagnosis not present

## 2022-12-04 DIAGNOSIS — Z713 Dietary counseling and surveillance: Secondary | ICD-10-CM | POA: Diagnosis not present

## 2022-12-04 DIAGNOSIS — Z6834 Body mass index (BMI) 34.0-34.9, adult: Secondary | ICD-10-CM | POA: Diagnosis not present

## 2022-12-04 DIAGNOSIS — Z7182 Exercise counseling: Secondary | ICD-10-CM | POA: Diagnosis not present

## 2022-12-10 DIAGNOSIS — M5416 Radiculopathy, lumbar region: Secondary | ICD-10-CM | POA: Diagnosis not present

## 2022-12-25 DIAGNOSIS — I10 Essential (primary) hypertension: Secondary | ICD-10-CM | POA: Diagnosis not present

## 2022-12-25 DIAGNOSIS — R7309 Other abnormal glucose: Secondary | ICD-10-CM | POA: Diagnosis not present

## 2022-12-25 DIAGNOSIS — E66812 Obesity, class 2: Secondary | ICD-10-CM | POA: Diagnosis not present

## 2022-12-25 DIAGNOSIS — Z6834 Body mass index (BMI) 34.0-34.9, adult: Secondary | ICD-10-CM | POA: Diagnosis not present

## 2022-12-25 DIAGNOSIS — G4733 Obstructive sleep apnea (adult) (pediatric): Secondary | ICD-10-CM | POA: Diagnosis not present

## 2022-12-25 DIAGNOSIS — R7303 Prediabetes: Secondary | ICD-10-CM | POA: Diagnosis not present

## 2023-02-08 DIAGNOSIS — J40 Bronchitis, not specified as acute or chronic: Secondary | ICD-10-CM | POA: Diagnosis not present

## 2023-02-08 DIAGNOSIS — Z87891 Personal history of nicotine dependence: Secondary | ICD-10-CM | POA: Diagnosis not present

## 2023-02-08 DIAGNOSIS — R059 Cough, unspecified: Secondary | ICD-10-CM | POA: Diagnosis not present

## 2023-02-08 DIAGNOSIS — R0981 Nasal congestion: Secondary | ICD-10-CM | POA: Diagnosis not present

## 2023-02-08 DIAGNOSIS — R0602 Shortness of breath: Secondary | ICD-10-CM | POA: Diagnosis not present

## 2023-08-14 IMAGING — CT CT HEART MORP W/ CTA COR W/ SCORE W/ CA W/CM &/OR W/O CM
4 of 7 series · 8 of 20 positions shown, 9 images · IV contrast (APPLIED)
Comparison: None.
COMPARISON: None.

Addendum:
EXAM:
OVER-READ INTERPRETATION  CT CHEST

The following report is an over-read performed by radiologist Dr.
Shiamaa Bird [REDACTED] on 01/05/2021. This
over-read does not include interpretation of cardiac or coronary
anatomy or pathology. The coronary calcium score/coronary CTA
interpretation by the cardiologist is attached.
CLINICAL DATA: CP, SVT
Cardiac/Coronary  CTA
TECHNIQUE: The patient was scanned on a [employer] Force scanner.

[Series 6: best diast · axial · 0.37mm/px · z∈[+1085,+1125]mm · 2 of 301 slices shown, 3 images]
[im 101/301  vessel]
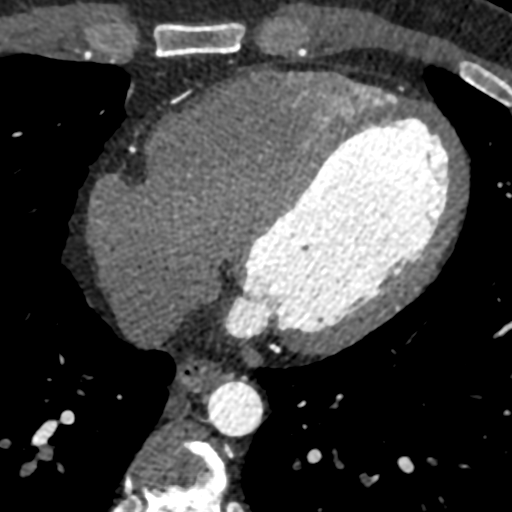
[im 101/301  lung]
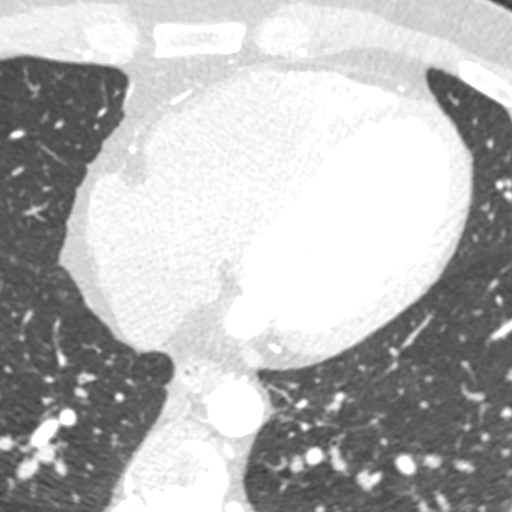
[im 201/301  vessel]
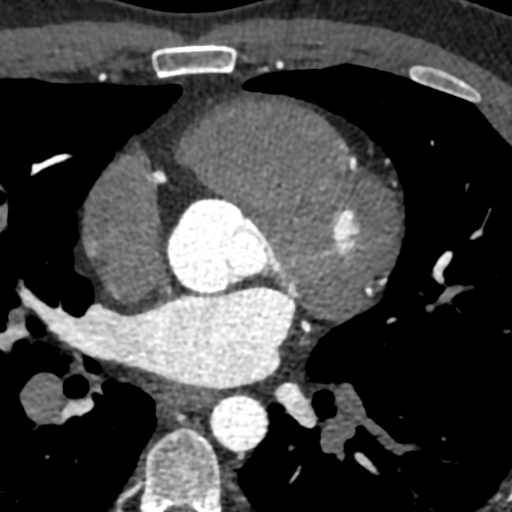

[Series 7: best syst · axial · 0.37mm/px · z∈[+1085,+1125]mm · 2 of 301 slices shown]
[im 101/301  vessel]
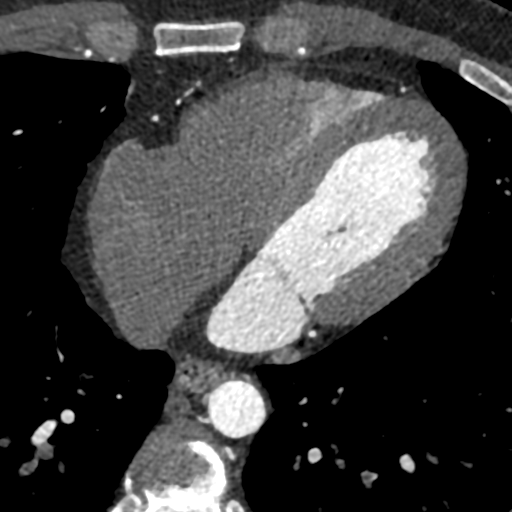
[im 201/301  vessel]
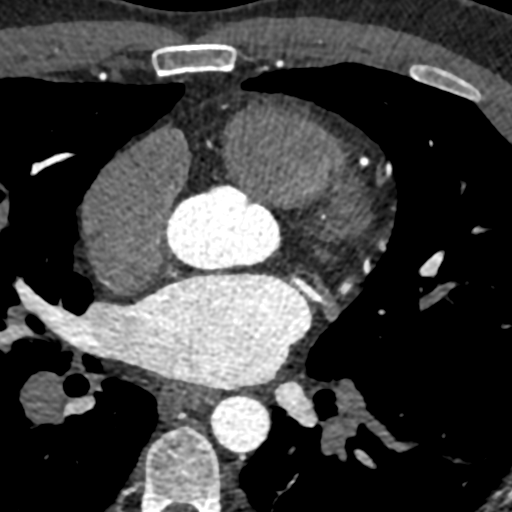

[Series 8: ts diast · axial · 0.37mm/px · z∈[+1085,+1125]mm · 2 of 301 slices shown]
[im 101/301  vessel]
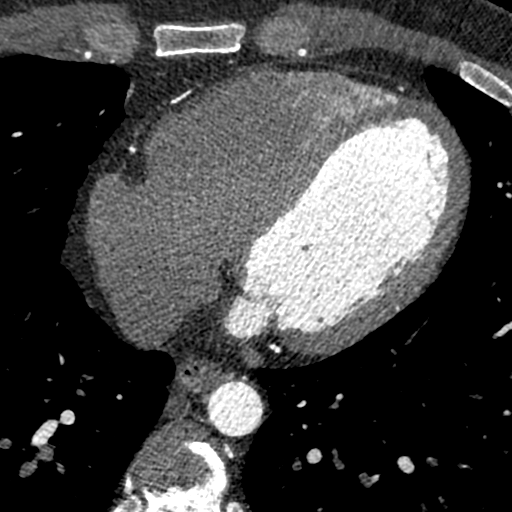
[im 201/301  vessel]
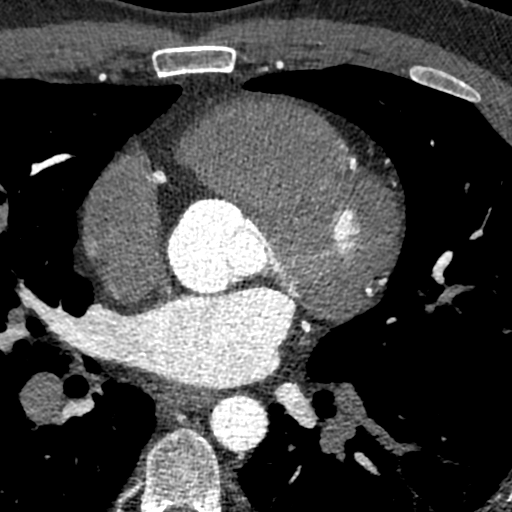

[Series 9: ts syst · axial · 0.37mm/px · z∈[+1085,+1125]mm · 2 of 301 slices shown]
[im 101/301  vessel]
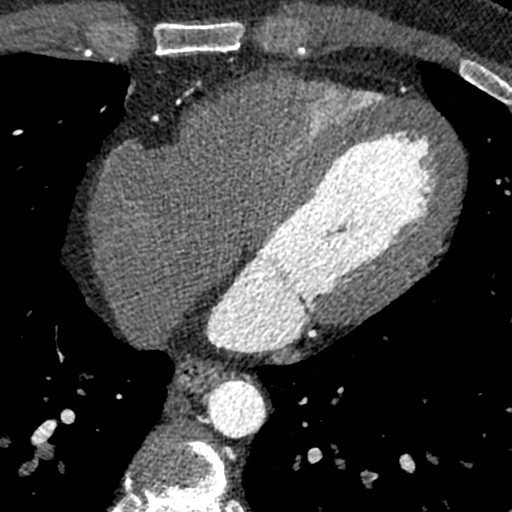
[im 201/301  vessel]
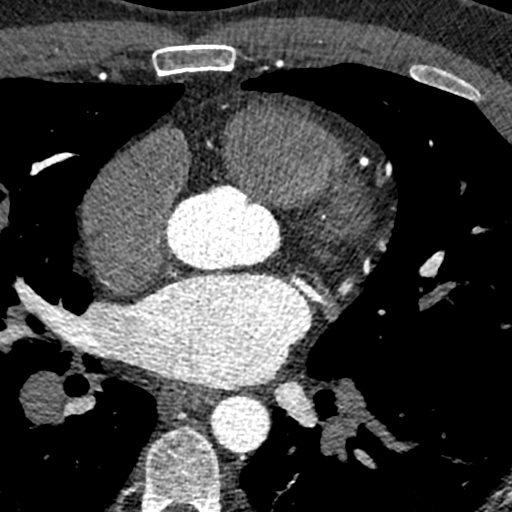

[8 of 20 positions shown; findings below may reference images not displayed]

FINDINGS: Within the visualized portions of the thorax there are no suspicious
appearing pulmonary nodules or masses, there is no acute
consolidative airspace disease, no pleural effusions, no
pneumothorax and no lymphadenopathy. Visualized portions of the
upper abdomen demonstrates diffuse low attenuation throughout the
visualized hepatic parenchyma, indicative of hepatic steatosis.
There are no aggressive appearing lytic or blastic lesions noted in
the visualized portions of the skeleton.
IMPRESSION: 1. Hepatic steatosis.
FINDINGS: A 110 kV prospective scan was triggered in the descending thoracic
aorta at 111 HU's. Axial non-contrast 3 mm slices were carried out
through the heart. The data set was analyzed on a dedicated work
station and scored using the Agatson method. Gantry rotation speed
was 250 msecs and collimation was .6 mm. No beta blockade and 0.8 mg
of sl NTG was given. The 3D data set was reconstructed in 5%
intervals of the 67-82 % of the R-R cycle. Diastolic phases were
analyzed on a dedicated work station using MPR, MIP and VRT modes.
The patient received 80 cc of contrast.

Aorta:  Normal size.  No calcifications.  No dissection.

Aortic Valve:  Trileaflet.  No calcifications.

Coronary Arteries:  Normal coronary origin.  Right dominance.

RCA is a large dominant artery that gives rise to PDA and PLA. There
is no plaque.

Left main is a large artery that gives rise to LAD, LCX arteries as
well as large Intermediate Branch.

LAD is a large vessel that has no plaque. This artery gives rise to
moderate size D1.

Intermediate Branch is very large.  There is no plaque.

LCX is a non-dominant artery that gives rise to one small OM1
branch. There is no plaque.

Other findings:

Normal pulmonary vein drainage into the left atrium.

Normal left atrial appendage without a thrombus.

Normal size of the pulmonary artery.
IMPRESSION: 1. Coronary calcium score of 0. This was 0 percentile for age and
sex matched control.

2. Normal coronary origin with right dominance.

3. No evidence of CAD.

*** End of Addendum ***
EXAM:
OVER-READ INTERPRETATION  CT CHEST

The following report is an over-read performed by radiologist Dr.
Shiamaa Bird [REDACTED] on 01/05/2021. This
over-read does not include interpretation of cardiac or coronary
anatomy or pathology. The coronary calcium score/coronary CTA
interpretation by the cardiologist is attached.
FINDINGS: Within the visualized portions of the thorax there are no suspicious
appearing pulmonary nodules or masses, there is no acute
consolidative airspace disease, no pleural effusions, no
pneumothorax and no lymphadenopathy. Visualized portions of the
upper abdomen demonstrates diffuse low attenuation throughout the
visualized hepatic parenchyma, indicative of hepatic steatosis.
There are no aggressive appearing lytic or blastic lesions noted in
the visualized portions of the skeleton.
IMPRESSION: 1. Hepatic steatosis.
